# Patient Record
Sex: Female | Born: 1975 | Race: White | Hispanic: No | Marital: Married | Smoking: Never smoker
Health system: Southern US, Community
[De-identification: ages and names within clinical notes are randomized; demographics above are authoritative.]

## PROBLEM LIST (undated history)

## (undated) DIAGNOSIS — Z800 Family history of malignant neoplasm of digestive organs: Secondary | ICD-10-CM

## (undated) DIAGNOSIS — K219 Gastro-esophageal reflux disease without esophagitis: Secondary | ICD-10-CM

---

## 2006-04-21 ENCOUNTER — Ambulatory Visit: Payer: Self-pay | Admitting: Internal Medicine

## 2014-05-04 ENCOUNTER — Encounter: Payer: Self-pay | Admitting: Gastroenterology

## 2020-04-23 ENCOUNTER — Other Ambulatory Visit: Payer: Self-pay | Admitting: Neurosurgery

## 2020-04-23 ENCOUNTER — Emergency Department (HOSPITAL_COMMUNITY)
Admission: EM | Admit: 2020-04-23 | Discharge: 2020-04-23 | DRG: 552 | Disposition: A | Discharge status: Home / Self Care | Payer: BC Managed Care – PPO | Attending: Neurosurgery | Admitting: Neurosurgery

## 2020-04-23 ENCOUNTER — Emergency Department (HOSPITAL_COMMUNITY): Payer: BC Managed Care – PPO

## 2020-04-23 ENCOUNTER — Other Ambulatory Visit: Payer: Self-pay

## 2020-04-23 ENCOUNTER — Encounter (HOSPITAL_COMMUNITY): Payer: Self-pay

## 2020-04-23 DIAGNOSIS — M25512 Pain in left shoulder: Secondary | ICD-10-CM | POA: Diagnosis present

## 2020-04-23 DIAGNOSIS — Z7984 Long term (current) use of oral hypoglycemic drugs: Secondary | ICD-10-CM | POA: Diagnosis not present

## 2020-04-23 DIAGNOSIS — M5020 Other cervical disc displacement, unspecified cervical region: Secondary | ICD-10-CM | POA: Diagnosis not present

## 2020-04-23 DIAGNOSIS — M792 Neuralgia and neuritis, unspecified: Secondary | ICD-10-CM

## 2020-04-23 IMAGING — MR MR CERVICAL SPINE W/O CM
5 series · 48 of 48 positions shown · non-contrast
Comparison: None.

CLINICAL DATA: Left arm radiculopathy.  Left shoulder pain 3 weeks.

EXAM:
MRI CERVICAL SPINE WITHOUT CONTRAST
TECHNIQUE: Multiplanar, multisequence MR imaging of the cervical spine was
performed. No intravenous contrast was administered.

[Series 5: T2 · sagittal · 3.0mm · 0.69mm/px · 6 of 15 slices shown]
[im 1/15]
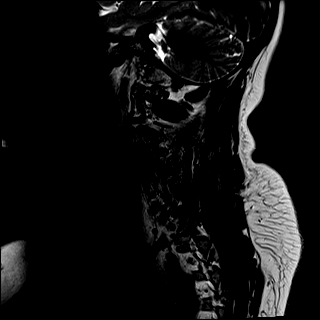
[im 3/15]
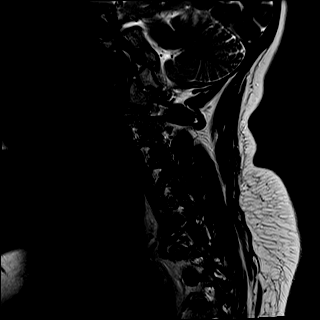
[im 6/15]
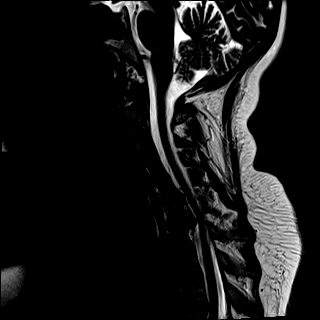
[im 9/15]
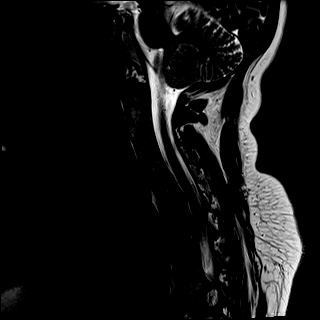
[im 12/15]
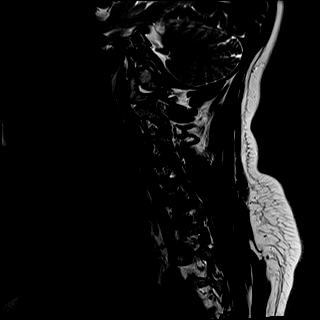
[im 15/15]
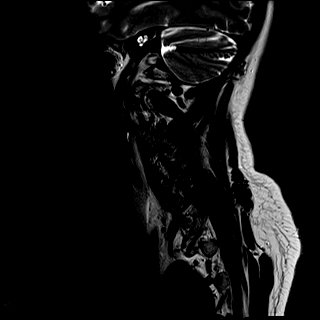

[Series 6: T1 · sagittal · 3.0mm · 0.69mm/px · 7 of 15 slices shown]
[im 1/15]
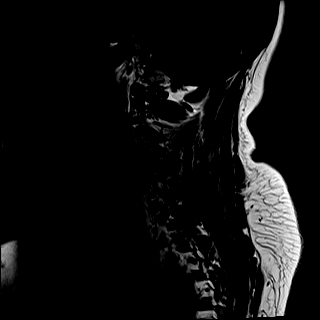
[im 3/15]
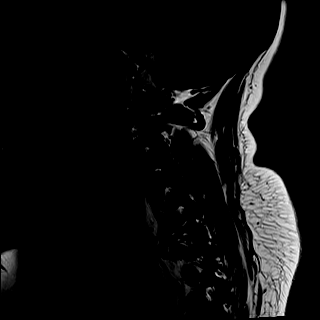
[im 5/15]
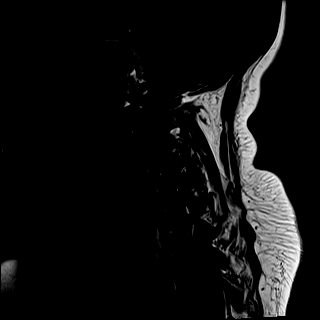
[im 8/15]
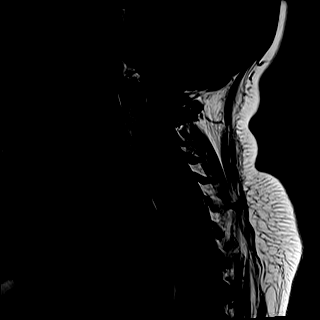
[im 10/15]
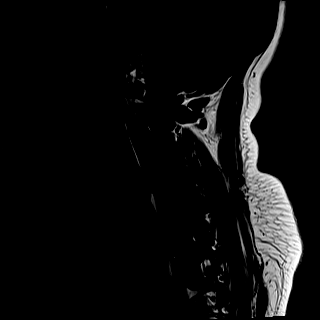
[im 12/15]
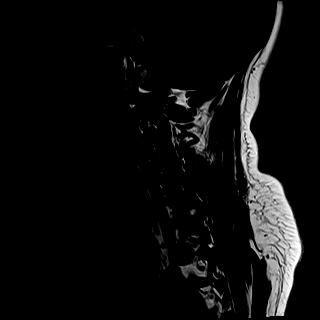
[im 15/15]
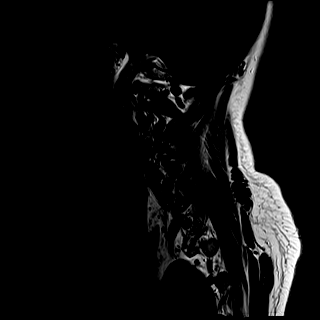

[Series 7: STIR · sagittal · 3.0mm · 0.86mm/px · 7 of 15 slices shown]
[im 1/15]
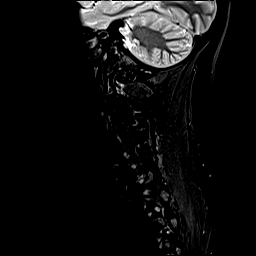
[im 3/15]
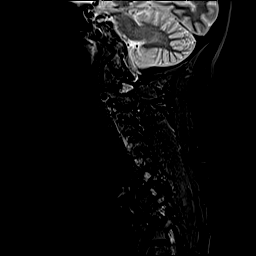
[im 5/15]
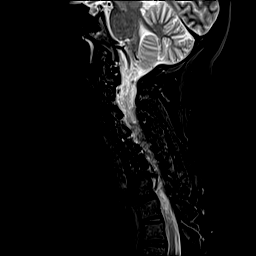
[im 8/15]
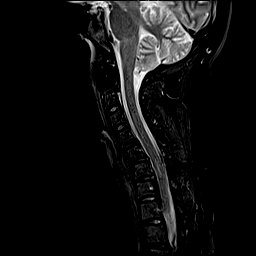
[im 10/15]
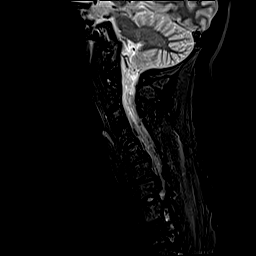
[im 12/15]
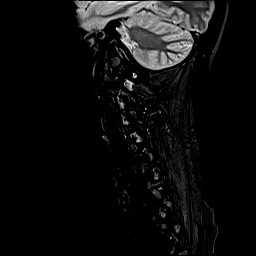
[im 15/15]
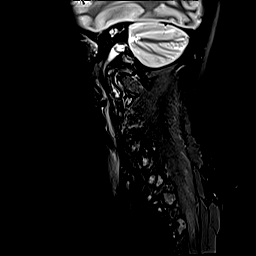

[Series 1018: csp ax t2_new · axial · 3.0mm · 0.66mm/px · z∈[-131,-43]mm · 14 of 32 slices shown]
[im 1/32]
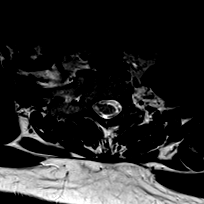
[im 3/32]
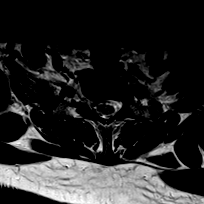
[im 5/32]
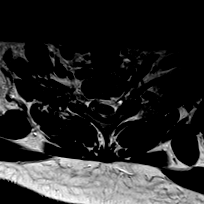
[im 8/32]
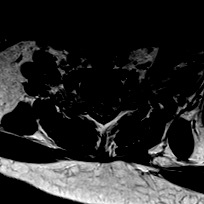
[im 10/32]
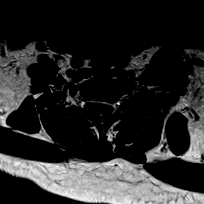
[im 12/32]
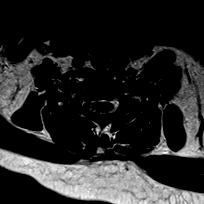
[im 15/32]
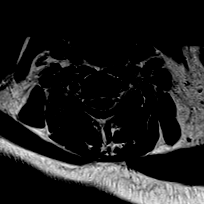
[im 17/32]
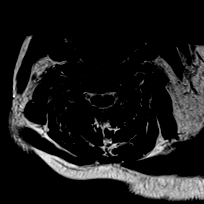
[im 20/32]
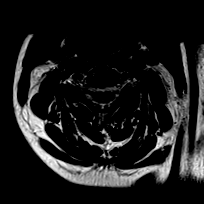
[im 22/32]
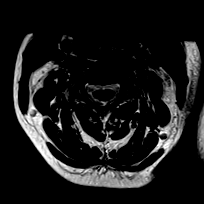
[im 24/32]
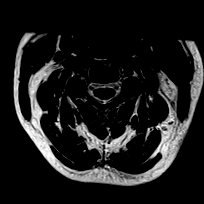
[im 27/32]
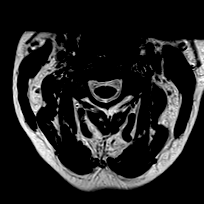
[im 29/32]
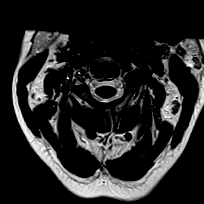
[im 32/32]
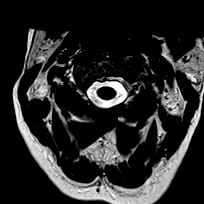

[Series 1026: csp ax gre_new · axial · 3.0mm · 0.39mm/px · z∈[-128,-40]mm · 14 of 32 slices shown]
[im 1/32]
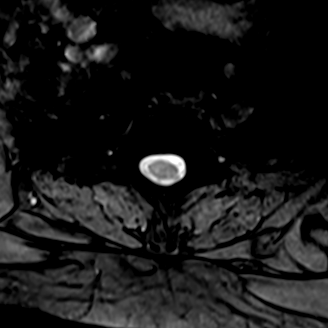
[im 3/32]
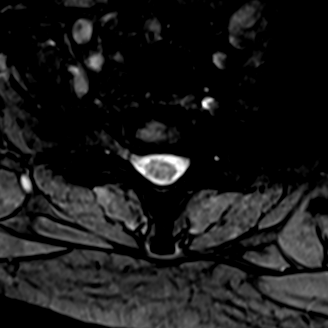
[im 5/32]
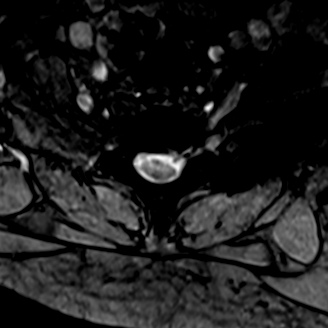
[im 8/32]
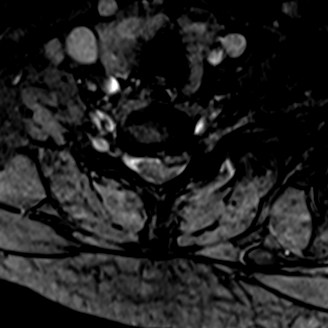
[im 10/32]
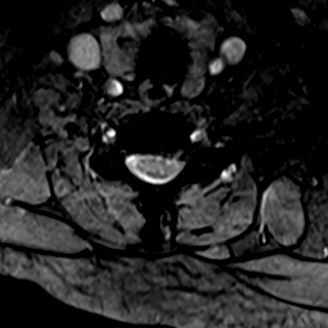
[im 12/32]
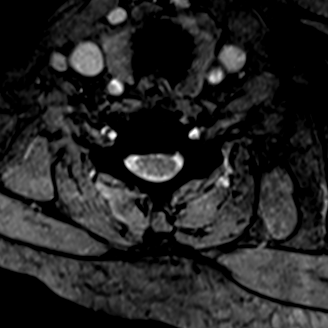
[im 15/32]
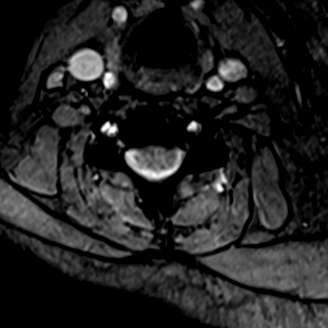
[im 17/32]
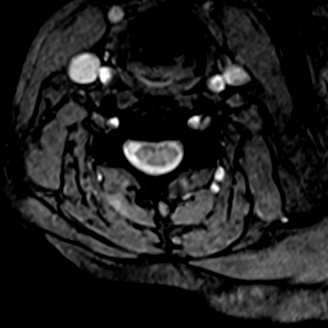
[im 20/32]
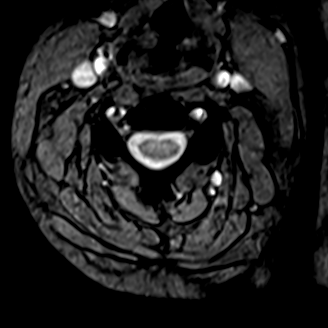
[im 22/32]
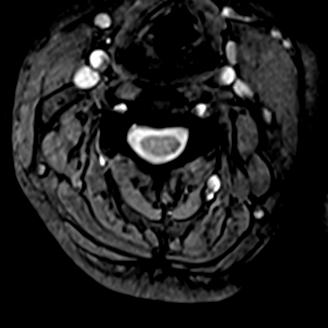
[im 24/32]
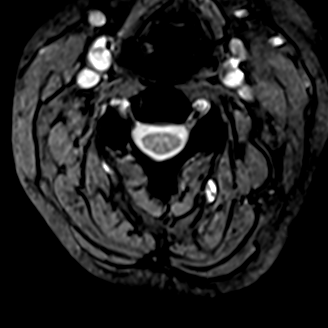
[im 27/32]
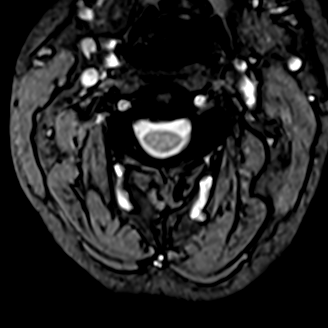
[im 29/32]
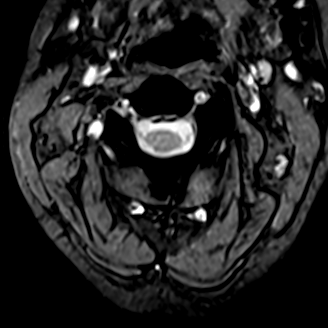
[im 32/32]
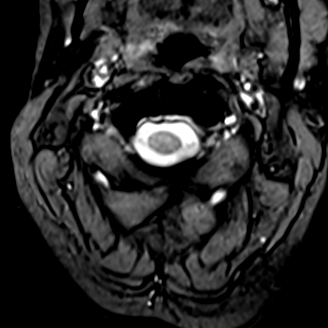

[48 of 48 positions shown; findings below may reference images not displayed]

FINDINGS: Alignment: Mild anterolisthesis C4-5 and C5-6. Cervical kyphosis at
C5.

Vertebrae: Negative for fracture or mass.  Normal bone marrow.

Cord: Normal spinal cord signal.  No cord lesion.

Posterior Fossa, vertebral arteries, paraspinal tissues: Negative

Disc levels:

C2-3: Negative

C3-4: Mild facet degeneration. Negative for disc protrusion or
stenosis

C4-5: Mild disc and mild facet degeneration. Negative for disc
protrusion or stenosis.

C5-6: Mild anterolisthesis. Disc degeneration with diffuse uncinate
spurring. Small left-sided disc protrusion. Mild foraminal narrowing
bilaterally. Mild spinal stenosis

C6-7: Large extruded disc fragment on the left with cord flattening
and severe left foraminal encroachment. Mild associated spurring.

C7-T1: Negative
IMPRESSION: Disc and facet degeneration at C5-6 with mild foraminal stenosis
bilaterally and mild spinal stenosis

Large extruded disc fragment on the left at C6-7. Severe left
foraminal encroachment and left cord flattening.

## 2020-04-23 MED ORDER — DEXAMETHASONE SODIUM PHOSPHATE 4 MG/ML IJ SOLN: 4.0000 mg | Freq: Four times a day (QID) | INTRAMUSCULAR | Status: DC

## 2020-04-23 MED ORDER — HYDROMORPHONE HCL 1 MG/ML IJ SOLN: 1.0000 mg | Freq: Once | INTRAMUSCULAR | Status: DC

## 2020-04-23 MED ORDER — HYDROMORPHONE HCL 1 MG/ML IJ SOLN: 0.5000 mg | INTRAMUSCULAR | Status: DC | PRN

## 2020-04-23 MED ORDER — DOCUSATE SODIUM 100 MG PO CAPS: 100.0000 mg | ORAL_CAPSULE | Freq: Two times a day (BID) | ORAL | Status: DC

## 2020-04-23 MED ORDER — SODIUM CHLORIDE 0.9% FLUSH: 3.0000 mL | INTRAVENOUS | Status: DC | PRN

## 2020-04-23 MED ORDER — DEXAMETHASONE SODIUM PHOSPHATE 10 MG/ML IJ SOLN
10.0000 mg | Freq: Once | INTRAMUSCULAR | Status: AC
  Administered 2020-04-23: 10 mg via INTRAMUSCULAR
  Filled 2020-04-23: qty 1

## 2020-04-23 MED ORDER — BISACODYL 10 MG RE SUPP: 10.0000 mg | Freq: Every day | RECTAL | Status: DC | PRN

## 2020-04-23 MED ORDER — OXYCODONE HCL 5 MG PO TABS: 5.0000 mg | ORAL_TABLET | ORAL | Status: DC | PRN

## 2020-04-23 MED ORDER — DIAZEPAM 5 MG PO TABS
10.0000 mg | ORAL_TABLET | Freq: Once | ORAL | Status: AC
  Administered 2020-04-23: 10 mg via ORAL
  Filled 2020-04-23: qty 2

## 2020-04-23 MED ORDER — SODIUM CHLORIDE 0.9 % IV SOLN: 250.0000 mL | INTRAVENOUS | Status: DC | PRN

## 2020-04-23 MED ORDER — ACETAMINOPHEN 650 MG RE SUPP: 650.0000 mg | Freq: Four times a day (QID) | RECTAL | Status: DC | PRN

## 2020-04-23 MED ORDER — METHOCARBAMOL 1000 MG/10ML IJ SOLN
500.0000 mg | Freq: Four times a day (QID) | INTRAVENOUS | Status: DC | PRN
  Filled 2020-04-23: qty 5

## 2020-04-23 MED ORDER — SODIUM CHLORIDE 0.9 % IV SOLN: INTRAVENOUS | Status: DC

## 2020-04-23 MED ORDER — POLYETHYLENE GLYCOL 3350 17 G PO PACK: 17.0000 g | PACK | Freq: Every day | ORAL | Status: DC | PRN

## 2020-04-23 MED ORDER — ONDANSETRON HCL 4 MG/2ML IJ SOLN: 4.0000 mg | Freq: Four times a day (QID) | INTRAMUSCULAR | Status: DC | PRN

## 2020-04-23 MED ORDER — ZOLPIDEM TARTRATE 5 MG PO TABS: 5.0000 mg | ORAL_TABLET | Freq: Every evening | ORAL | Status: DC | PRN

## 2020-04-23 MED ORDER — ACETAMINOPHEN 325 MG PO TABS: 650.0000 mg | ORAL_TABLET | Freq: Four times a day (QID) | ORAL | Status: DC | PRN

## 2020-04-23 MED ORDER — SODIUM CHLORIDE 0.9% FLUSH: 3.0000 mL | Freq: Two times a day (BID) | INTRAVENOUS | Status: DC

## 2020-04-23 MED ORDER — ONDANSETRON HCL 4 MG PO TABS: 4.0000 mg | ORAL_TABLET | Freq: Four times a day (QID) | ORAL | Status: DC | PRN

## 2020-04-23 NOTE — ED Provider Notes (Signed)
Signout from Dr. Jodi Mourning.  44 year old female with left shoulder pain and left radicular symptoms.  MRI showing disc herniation.  Plan is to admit to [facility] for neurosurgical procedure. Physical Exam  BP (!) 121/62 (BP Location: Right Arm)   Pulse 83   Temp 98.2 F (36.8 C) (Oral)   Resp 14   Ht 5\' 7"  (1.702 m)   Wt (!) 93.9 kg   LMP 04/16/2020   SpO2 100%   BMI 32.42 kg/m   Physical Exam  ED Course/Procedures     Procedures  MDM  I was informed that the patient wanted to talk to neurosurgery regarding the procedure.  I conferenced her with 04/18/2020 PA and they discussed possible options.  Ultimately she decided to be discharged here and go to the clinic tomorrow where she can be evaluated.  Return instructions discussed.       Cassell Smiles, MD 04/24/20 [phone]

## 2020-04-23 NOTE — ED Notes (Signed)
States she prefers to go home and be seen in the office tomorrow.

## 2020-04-23 NOTE — ED Provider Notes (Addendum)
[facility] EMERGENCY DEPARTMENT Provider Note   CSN: [id number] Arrival date & time: 04/23/20  1150     History Chief Complaint  Patient presents with  . Shoulder Pain    Jocelyn Crosby is a 44 y.o. female.  Patient presents worsening left shoulder pain radiating down left arm with numbness in pointer and middle fingers for the past 16 days.  Pain intermittent but gradually worsening.  Pain makes it difficulty to sleep and she has to be in specific positions.  No history of back or neck surgeries or issues.  No injuries.  Patient has tried multiple different medications over-the-counter and prescribed with no significant improvement including steroids, narcotics, Tylenol and chiropractics.        History reviewed. No pertinent past medical history.  Patient Active Problem List   Diagnosis Date Noted  . 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 Cervical herniated disc 04/23/2020    History reviewed. No pertinent surgical history.   OB History   No obstetric history on file.     No family history on file.  Social History   Tobacco Use  . Smoking status: Not on file  Substance Use Topics  . Alcohol use: Not on file  . Drug use: Not on file    Home Medications Prior to Admission medications   Medication Sig Start Date End Date Taking? Authorizing Provider  gabapentin (NEURONTIN) 300 MG capsule Take 300 mg by mouth in the morning and at bedtime. 04/21/20  Yes [provider]  HYDROcodone-acetaminophen (NORCO/VICODIN) 5-325 MG tablet Take 1 tablet by mouth every 6 (six) hours as needed for moderate pain.  04/22/20  Yes [provider]  metFORMIN (GLUCOPHAGE) 500 MG tablet Take 500 mg by mouth 2 (two) times daily. 04/13/20  Yes [provider]  omeprazole (PRILOSEC) 40 MG capsule Take 40 mg by mouth every evening.   Yes [provider]    Allergies    Patient has no known allergies.  Review of Systems   Review of Systems    Constitutional: Negative for chills and fever.  HENT: Negative for congestion.   Eyes: Negative for visual disturbance.  Respiratory: Negative for shortness of breath.   Cardiovascular: Negative for chest pain.  Gastrointestinal: Negative for abdominal pain and vomiting.  Genitourinary: Negative for dysuria and flank pain.  Musculoskeletal: Positive for neck pain. Negative for back pain and neck stiffness.  Skin: Negative for rash.  Neurological: Positive for numbness. Negative for weakness, light-headedness and headaches.    Physical Exam Updated Vital Signs BP (!) 121/62 (BP Location: Right Arm)   Pulse 83   Temp 98.2 F (36.8 C) (Oral)   Resp 14   Ht 5\' 7"  (1.702 m)   Wt (!) 93.9 kg   LMP 04/16/2020   SpO2 100%   BMI 32.42 kg/m   Physical Exam Vitals and nursing note reviewed.  Constitutional:      Appearance: She is well-developed.  HENT:     Head: Normocephalic and atraumatic.  Eyes:     General:        Right eye: No discharge.        Left eye: No discharge.     Conjunctiva/sclera: Conjunctivae normal.  Neck:     Trachea: No tracheal deviation.  Cardiovascular:     Rate and Rhythm: Normal rate.  Pulmonary:     Effort: Pulmonary effort is normal.  Abdominal:     General: There is no distension.     Palpations: Abdomen  is soft.     Tenderness: There is no abdominal tenderness. There is no guarding.  Musculoskeletal:        General: Tenderness present.     Cervical back: Normal range of motion and neck supple.  Skin:    General: Skin is warm.     Findings: No rash.  Neurological:     Mental Status: She is alert and oriented to person, place, and time.     Comments: Patient's position of comfort holding left arm and forearm at her side.  Pain with abduction.  Patient has sensation to palpation intact however decreased sensation index and middle finger on the left.  Patient has normal 5+ strength with discomfort shoulder abduction, arm flexion, wrist extension  and flexion and finger abduction and adduction.     ED Results / Procedures / Treatments   Labs (all labs ordered are listed, but only abnormal results are displayed) Labs Reviewed  SARS CORONAVIRUS 2 BY RT PCR (HOSPITAL ORDER, PERFORMED IN [facility])  CBC WITH DIFFERENTIAL/PLATELET  BASIC METABOLIC PANEL  HIV ANTIBODY (ROUTINE TESTING W REFLEX)  POC URINE PREG, ED    EKG None  Radiology MR Cervical Spine Wo Contrast  Result Date: 04/23/2020 CLINICAL DATA:  Left arm radiculopathy.  Left shoulder pain 3 weeks. EXAM: MRI CERVICAL SPINE WITHOUT CONTRAST TECHNIQUE: Multiplanar, multisequence MR imaging of the cervical spine was performed. No intravenous contrast was administered. COMPARISON:  None. FINDINGS: Alignment: Mild anterolisthesis C4-5 and C5-6. Cervical kyphosis at C5. Vertebrae: Negative for fracture or mass.  Normal bone marrow. Cord: Normal spinal cord signal.  No cord lesion. Posterior Fossa, vertebral arteries, paraspinal tissues: Negative Disc levels: C2-3: Negative C3-4: Mild facet degeneration. Negative for disc protrusion or stenosis C4-5: Mild disc and mild facet degeneration. Negative for disc protrusion or stenosis. C5-6: Mild anterolisthesis. Disc degeneration with diffuse uncinate spurring. Small left-sided disc protrusion. Mild foraminal narrowing bilaterally. Mild spinal stenosis C6-7: Large extruded disc fragment on the left with cord flattening and severe left foraminal encroachment. Mild associated spurring. C7-T1: Negative IMPRESSION: Disc and facet degeneration at C5-6 with mild foraminal stenosis bilaterally and mild spinal stenosis Large extruded disc fragment on the left at C6-7. Severe left foraminal encroachment and left cord flattening. Electronically Signed   By: Marlan Palau M.D.   On: 04/23/2020 14:51    Procedures Procedures (including critical care time)  Medications Ordered in ED Medications  HYDROmorphone (DILAUDID) injection 1 mg  (has no administration in time range)  0.9 %  sodium chloride infusion (has no administration in time range)  zolpidem (AMBIEN) tablet 5 mg (has no administration in time range)  docusate sodium (COLACE) capsule 100 mg (has no administration in time range)  polyethylene glycol (MIRALAX / GLYCOLAX) packet 17 g (has no administration in time range)  bisacodyl (DULCOLAX) suppository 10 mg (has no administration in time range)  ondansetron (ZOFRAN) tablet 4 mg (has no administration in time range)    Or  ondansetron (ZOFRAN) injection 4 mg (has no administration in time range)  methocarbamol (ROBAXIN) 500 mg in dextrose 5 % 50 mL IVPB (has no administration in time range)  HYDROmorphone (DILAUDID) injection 0.5-1 mg (has no administration in time range)  oxyCODONE (Oxy IR/ROXICODONE) immediate release tablet 5 mg (has no administration in time range)  acetaminophen (TYLENOL) tablet 650 mg (has no administration in time range)    Or  acetaminophen (TYLENOL) suppository 650 mg (has no administration in time range)  sodium chloride flush (NS)  0.9 % injection 3 mL (has no administration in time range)  sodium chloride flush (NS) 0.9 % injection 3 mL (has no administration in time range)  0.9 %  sodium chloride infusion (has no administration in time range)  dexamethasone (DECADRON) injection 4 mg (has no administration in time range)  diazepam (VALIUM) tablet 10 mg (10 mg Oral Given 04/23/20 1337)  dexamethasone (DECADRON) injection 10 mg (10 mg Intramuscular Given 04/23/20 1337)    ED Course  I have reviewed the triage vital signs and the nursing notes.  Pertinent labs & imaging results that were available during my care of the patient were reviewed by me and considered in my medical decision making (see chart for details).    MDM Rules/Calculators/A&P                          MRI results reviewed showing large extruded disc fragment at C6-7 with encroachment and cord flattening. With  significant pain, worsening signs and symptoms discussed with neurosurgery who agrees with transfer to [facility] to plan for surgery hopefully in the next few days. Basic blood work and Covid test added.  Discussed with patient transfer to [facility].  Patient would like to speak with neurosurgery to gauge further details on how many days in the hospital and more details on surgery before transfer.  Repaged neurosurgery.    Final Clinical Impression(s) / ED Diagnoses Final diagnoses:  Cervical disc herniation  Radicular pain in left arm    Rx / DC Orders ED Discharge Orders    None       Blane Ohara, MD 04/23/20 1558    Blane Ohara, MD 04/23/20 [phone]

## 2020-04-23 NOTE — ED Triage Notes (Signed)
Pt is having left shoulder pain that started 3 weeks ago. 2 weeks ago she began having numbness  And pain that radiated down her arm. Seen PCP and needs an MRI. Her insurance was denied for MRI, so she is here today.

## 2020-04-23 NOTE — Discharge Instructions (Addendum)
Follow-up with neurosurgery.  Take your medications as previously prescribed.

## 2020-04-23 NOTE — ED Notes (Signed)
Discharged during downtime , to followup with neurology tomorrow.

## 2020-04-25 ENCOUNTER — Inpatient Hospital Stay: Admit: 2020-04-25 | Payer: BC Managed Care – PPO | Admitting: Neurosurgery

## 2020-04-25 SURGERY — ANTERIOR CERVICAL DECOMPRESSION/DISCECTOMY FUSION 2 LEVELS
Anesthesia: General

## 2020-08-09 ENCOUNTER — Other Ambulatory Visit: Payer: Self-pay | Admitting: Physician Assistant

## 2020-08-09 ENCOUNTER — Other Ambulatory Visit (HOSPITAL_COMMUNITY): Payer: Self-pay | Admitting: Physician Assistant

## 2020-08-09 DIAGNOSIS — M542 Cervicalgia: Secondary | ICD-10-CM

## 2020-08-09 DIAGNOSIS — M5441 Lumbago with sciatica, right side: Secondary | ICD-10-CM

## 2020-08-21 ENCOUNTER — Ambulatory Visit (HOSPITAL_COMMUNITY)
Admission: RE | Admit: 2020-08-21 | Discharge: 2020-08-21 | Disposition: A | Discharge status: Home / Self Care | Payer: BC Managed Care – PPO | Source: Ambulatory Visit | Attending: Physician Assistant | Admitting: Physician Assistant

## 2020-08-21 ENCOUNTER — Other Ambulatory Visit: Payer: Self-pay

## 2020-08-21 DIAGNOSIS — M542 Cervicalgia: Secondary | ICD-10-CM | POA: Diagnosis present

## 2020-08-21 DIAGNOSIS — M5441 Lumbago with sciatica, right side: Secondary | ICD-10-CM | POA: Insufficient documentation

## 2020-08-21 IMAGING — MR MR CERVICAL SPINE W/O CM
5 series · 36 of 48 positions shown · non-contrast
Comparison: [DATE]

CLINICAL DATA: Low back pain radiating down both legs. Neck pain
with bilateral finger numbness. History of cervical spine surgery

EXAM:
MRI CERVICAL SPINE WITHOUT CONTRAST
TECHNIQUE: Multiplanar, multisequence MR imaging of the cervical spine was
performed. No intravenous contrast was administered.

[Series 5: t2_tse_sag_fast · sagittal · 3.0mm · 0.43mm/px · 6 of 15 slices shown]
[im 1/15]
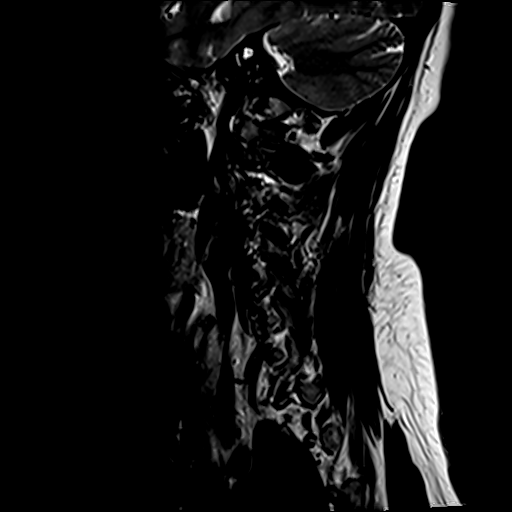
[im 3/15]
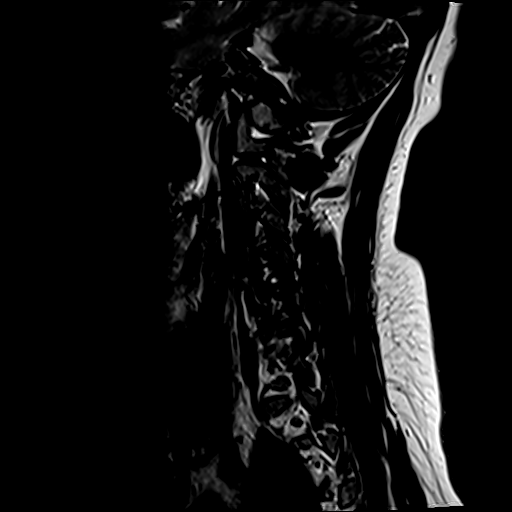
[im 6/15]
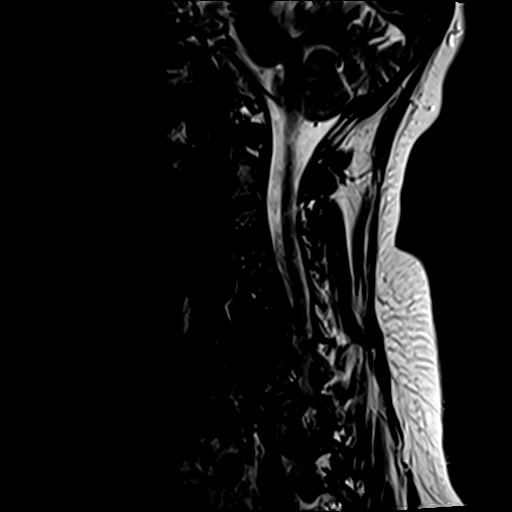
[im 9/15]
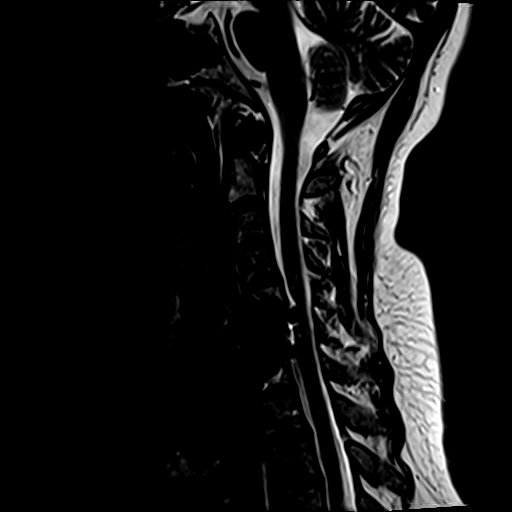
[im 12/15]
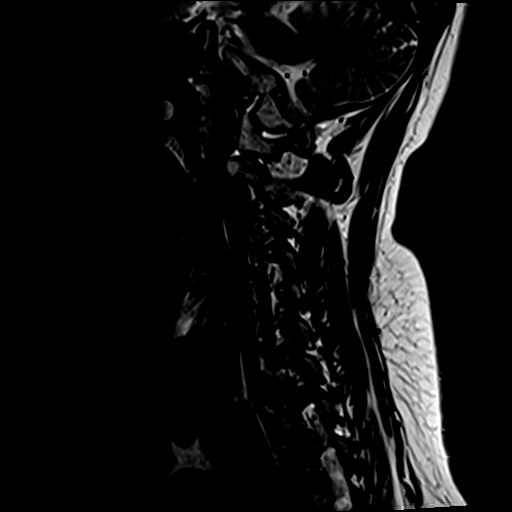
[im 15/15]
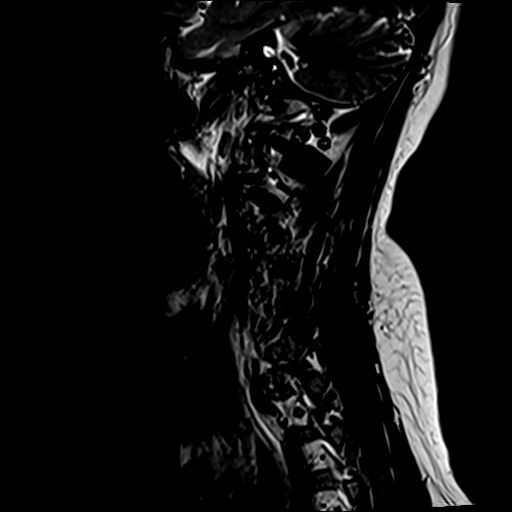

[Series 6: t1_tse_sag_fast · sagittal · 3.0mm · 0.43mm/px · 6 of 15 slices shown]
[im 1/15]
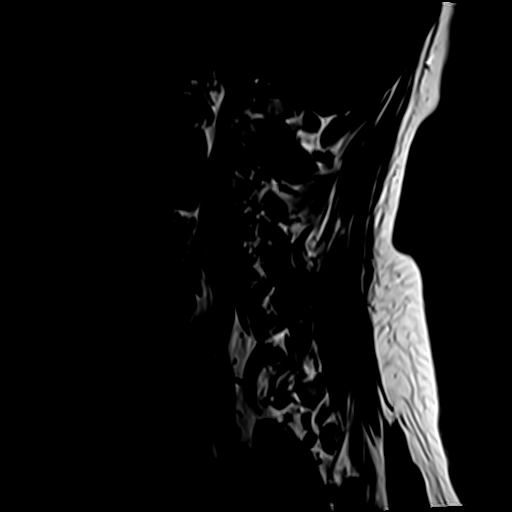
[im 3/15]
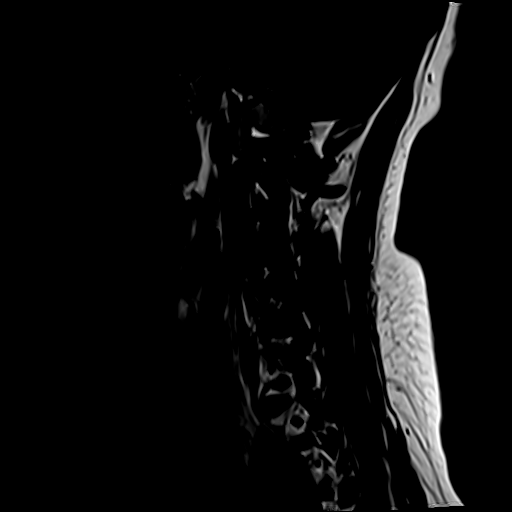
[im 6/15]
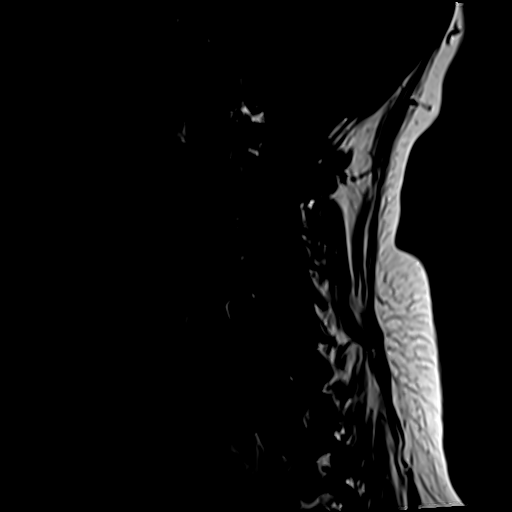
[im 9/15]
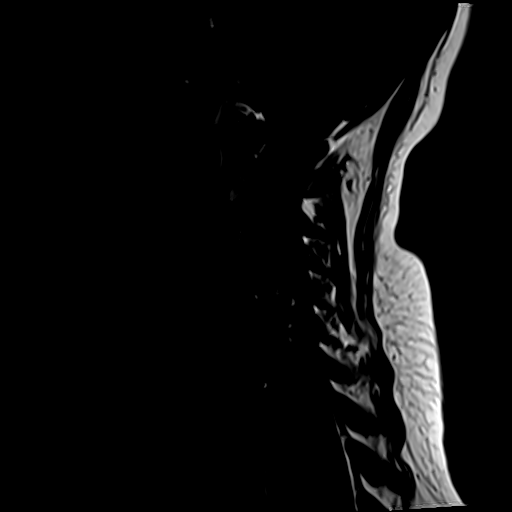
[im 12/15]
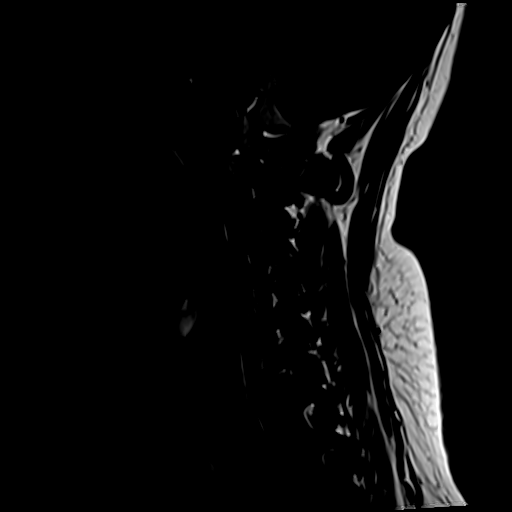
[im 15/15]
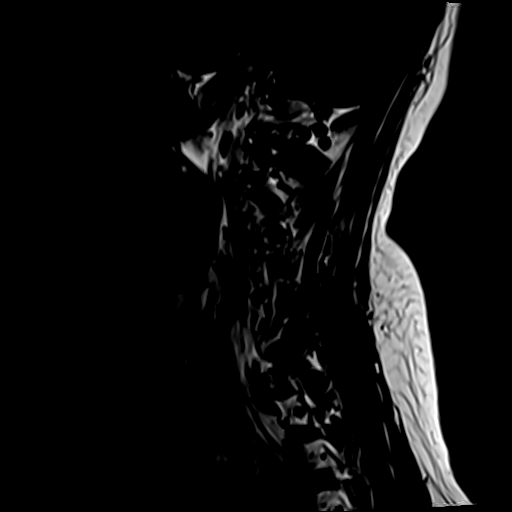

[Series 7: STIR · sagittal · 3.0mm · 0.86mm/px · 6 of 15 slices shown]
[im 1/15]
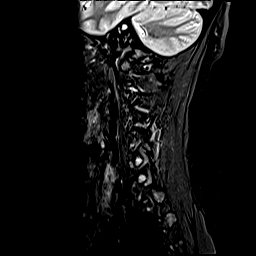
[im 3/15]
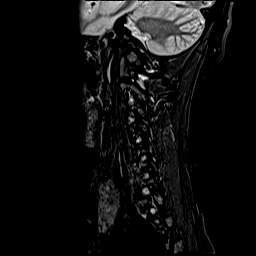
[im 6/15]
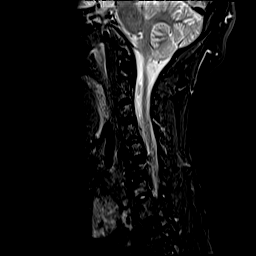
[im 9/15]
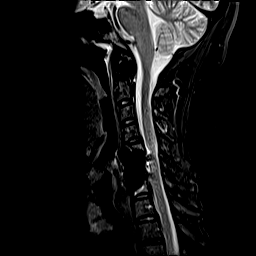
[im 12/15]
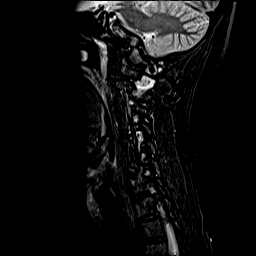
[im 15/15]
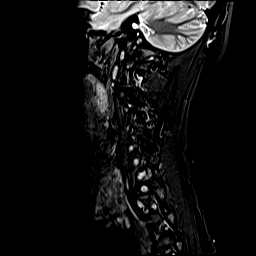

[Series 8: t2_tse_tra_fast · axial · 3.0mm · 0.78mm/px · z∈[-129,-10]mm · 10 of 38 slices shown]
[im 1/38]
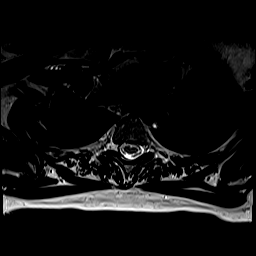
[im 3/38]
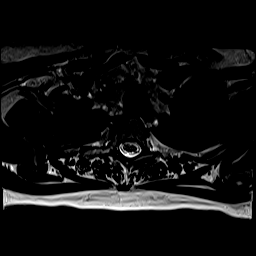
[im 6/38]
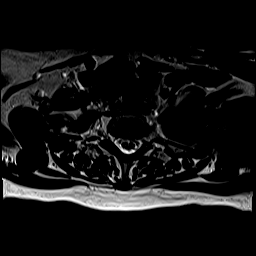
[im 11/38]
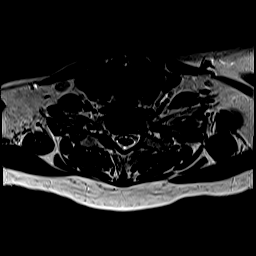
[im 16/38]
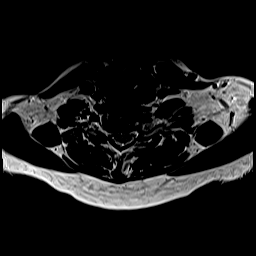
[im 19/38]
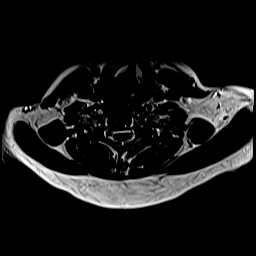
[im 22/38]
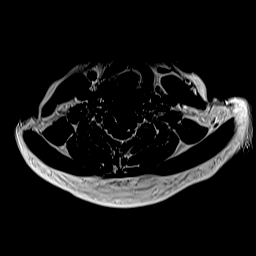
[im 27/38]
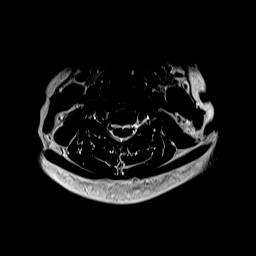
[im 32/38]
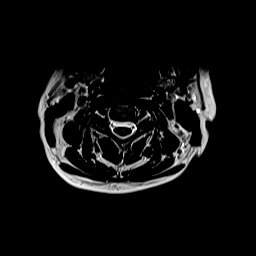
[im 38/38]
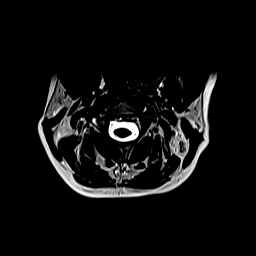

[Series 9: GRE · axial · 3.0mm · 0.78mm/px · z∈[-129,-10]mm · 8 of 38 slices shown]
[im 1/38]
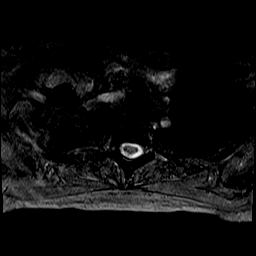
[im 6/38]
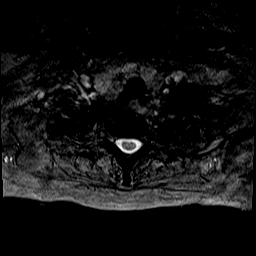
[im 11/38]
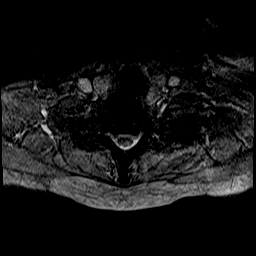
[im 16/38]
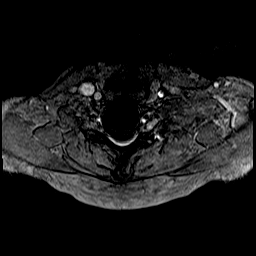
[im 22/38]
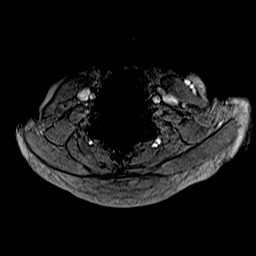
[im 27/38]
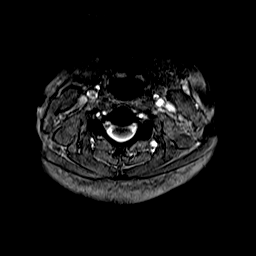
[im 32/38]
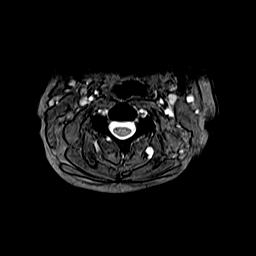
[im 38/38]
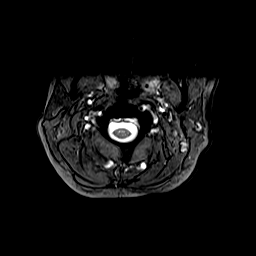

[36 of 48 positions shown; findings below may reference images not displayed]

FINDINGS: Alignment: Improved spinal alignment, now with generalized
straightening rather than kyphosis.

Vertebrae: Interval C5-6 and C6-7 ACDF. Artifact limits
visualization of the postoperative vertebral bodies. No evidence of
fracture, discitis, or aggressive bone lesion.

Cord: Normal signal and morphology.

Posterior Fossa, vertebral arteries, paraspinal tissues: No visible
mass or inflammation. ~2 cm right thyroid nodule, obscured on
prior.

Disc levels:

C2-3: Unremarkable.

C3-4: Unremarkable.

C4-5: Unremarkable.

C5-6: ACDF.  The canal and foramina appear patent on axial slices

C6-7: ACDF.  The canal and foramina appear patent

C7-T1:Unremarkable.
IMPRESSION: 1. C5-6 and C6-7 ACDF without complicating features.
2. No interval adjacent segment degeneration. No neural impingement.
3. ~2 cm right thyroid nodule. Recommend thyroid US.(ref: [HOSPITAL]. [DATE]): 143-50).

## 2020-08-21 IMAGING — MR MR LUMBAR SPINE W/O CM
5 series · 33 of 48 positions shown · non-contrast
Comparison: None.

CLINICAL DATA: Low back pain radiating down both legs for 2 weeks.

EXAM:
MRI LUMBAR SPINE WITHOUT CONTRAST
TECHNIQUE: Multiplanar, multisequence MR imaging of the lumbar spine was
performed. No intravenous contrast was administered.

[Series 26: T2 · sagittal · 4.0mm · 0.81mm/px · 6 of 16 slices shown (1 of 2)]
[im 1/16]
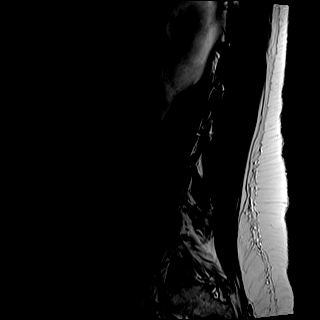
[im 4/16]
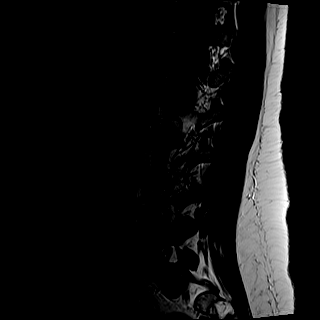
[im 7/16]
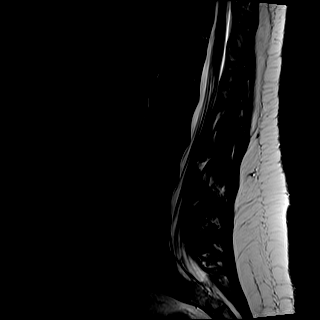
[im 10/16]
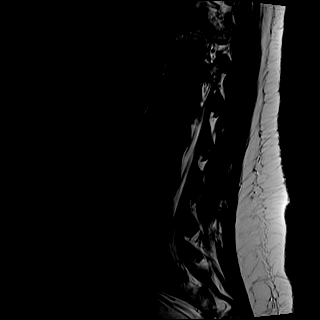
[im 13/16]
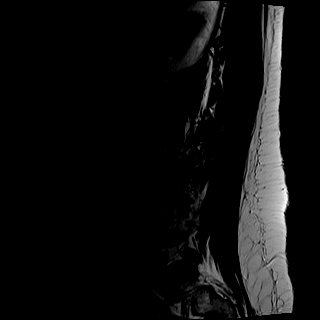
[im 16/16]
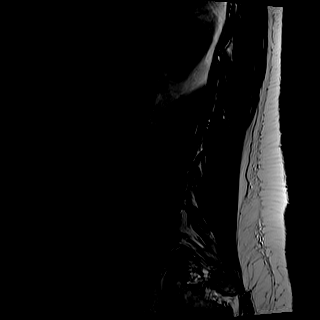

[Series 27: STIR · sagittal · 4.0mm · 0.51mm/px · 4 of 16 slices shown]
[im 1/16]
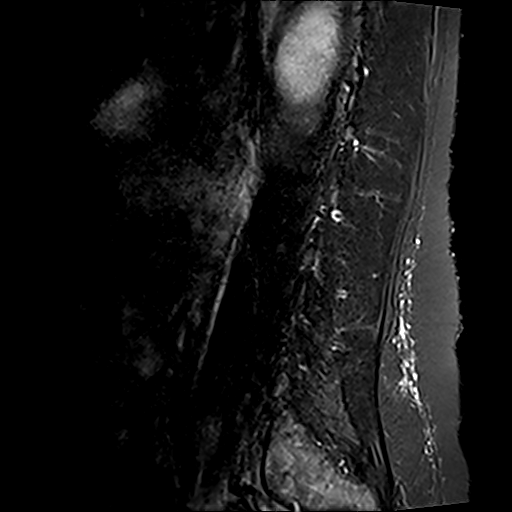
[im 3/16]
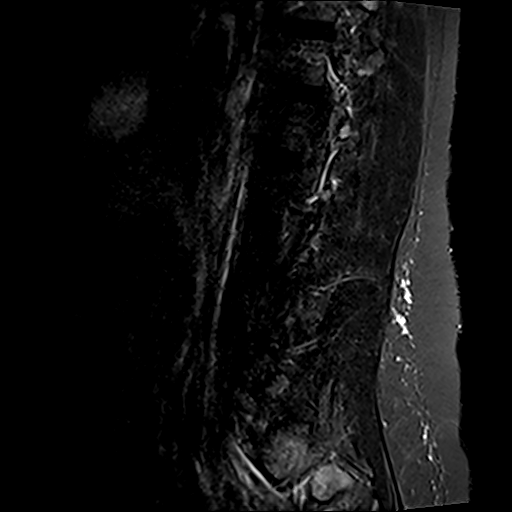
[im 6/16]
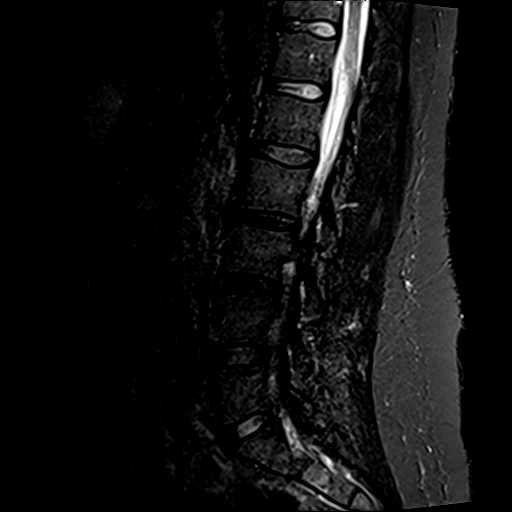
[im 8/16]
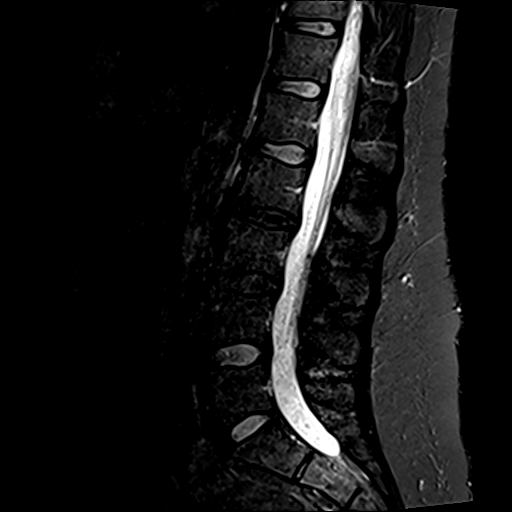

[Series 28: T1 · sagittal · 4.0mm · 1.02mm/px · 7 of 16 slices shown (1 of 2)]
[im 1/16]
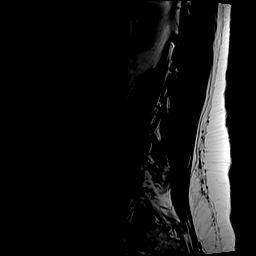
[im 3/16]
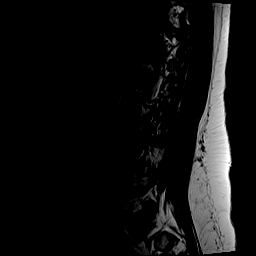
[im 6/16]
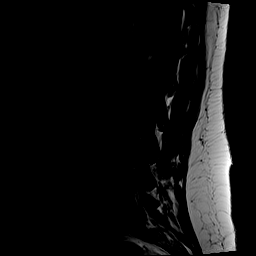
[im 8/16]
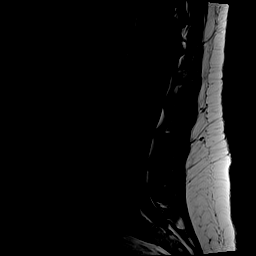
[im 11/16]
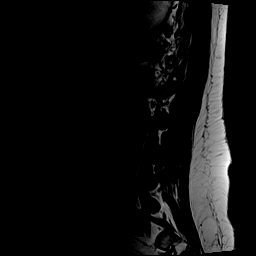
[im 13/16]
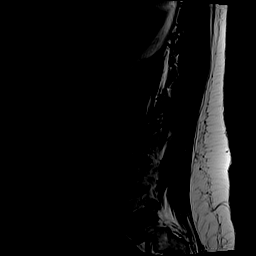
[im 16/16]
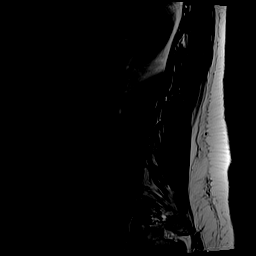

[Series 29: T2 · axial · 4.0mm · 0.70mm/px · z∈[+380,+561]mm · 8 of 31 slices shown (2 of 2)]
[im 1/31]
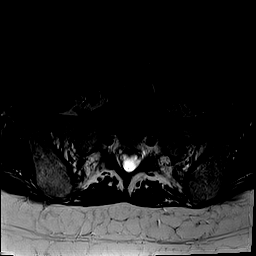
[im 5/31]
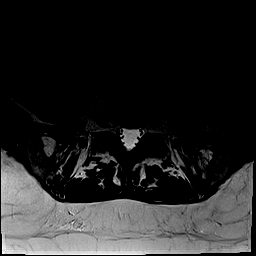
[im 10/31]
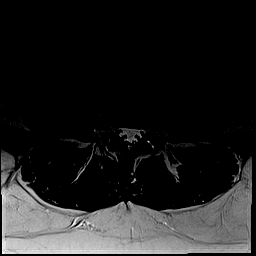
[im 14/31]
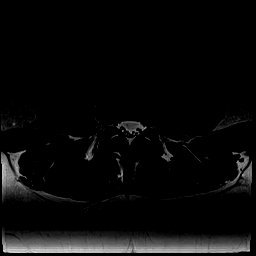
[im 17/31]
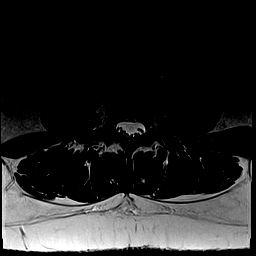
[im 21/31]
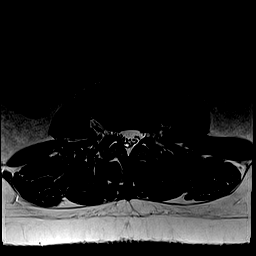
[im 26/31]
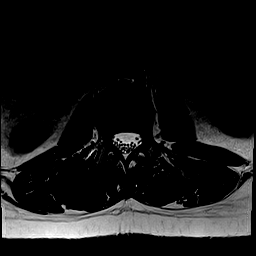
[im 31/31]
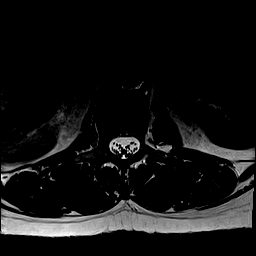

[Series 30: T1 · axial · 4.0mm · 0.35mm/px · z∈[+380,+561]mm · 8 of 31 slices shown (2 of 2)]
[im 1/31]
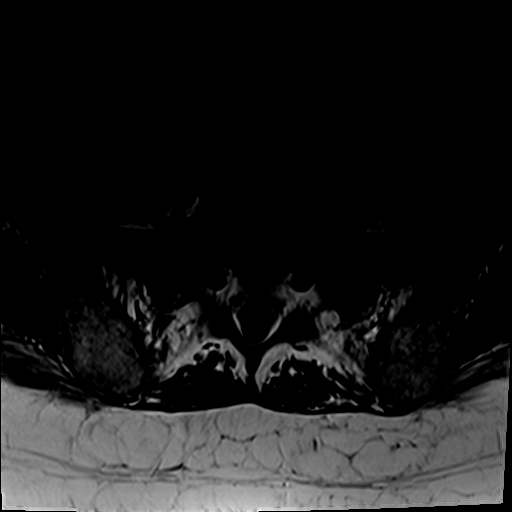
[im 5/31]
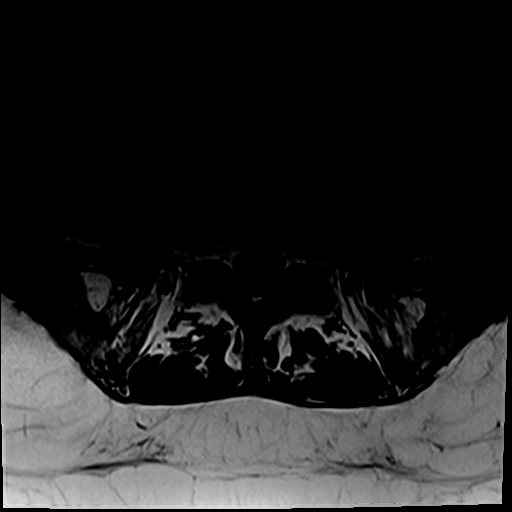
[im 10/31]
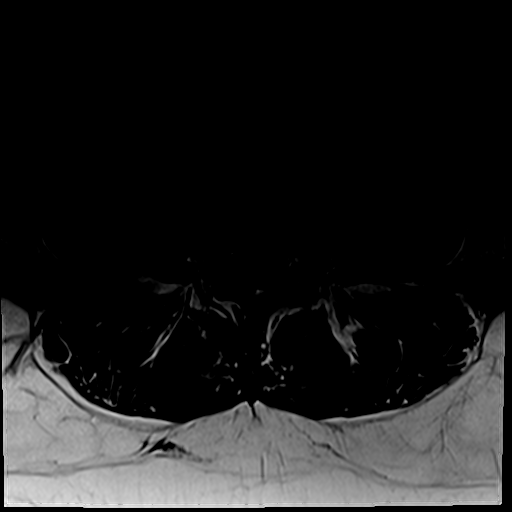
[im 14/31]
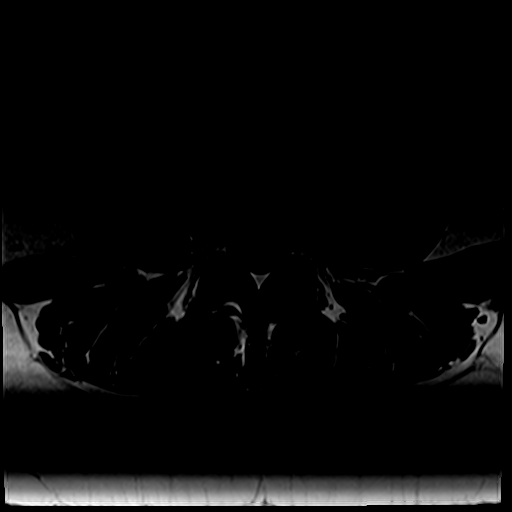
[im 17/31]
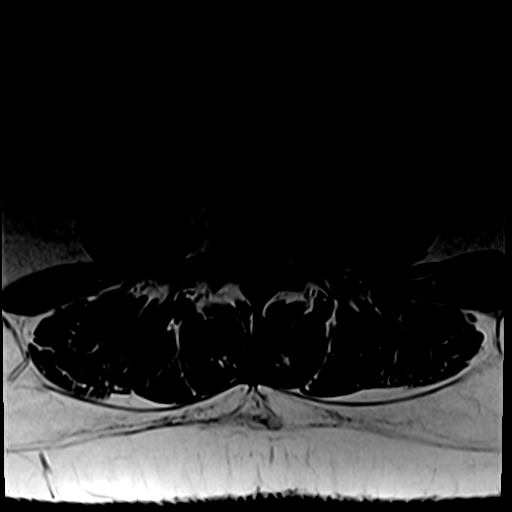
[im 21/31]
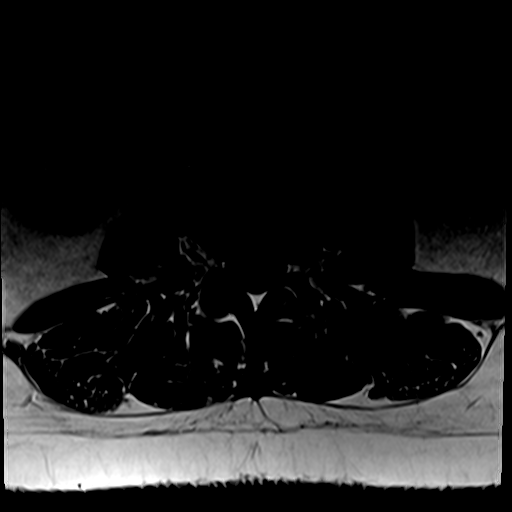
[im 26/31]
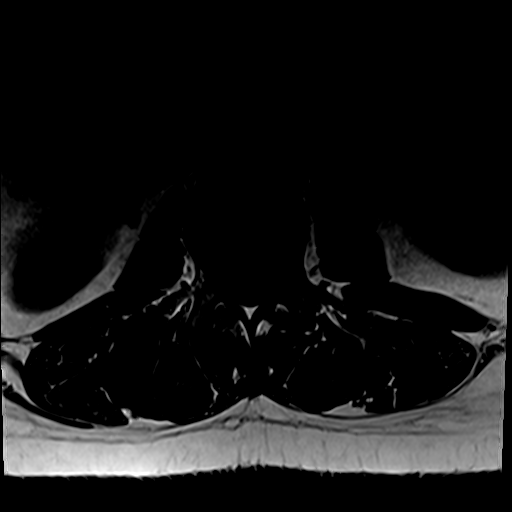
[im 31/31]
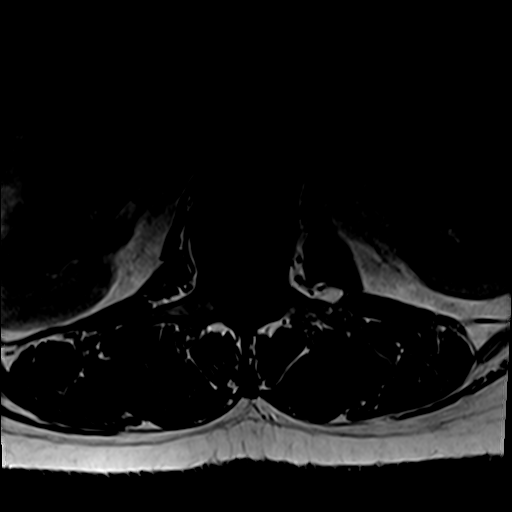

[33 of 48 positions shown; findings below may reference images not displayed]

FINDINGS: Segmentation: Standard lumbar numbering based on the available
coverage

Alignment:  Physiologic.

Vertebrae:  No fracture, evidence of discitis, or bone lesion.

Conus medullaris and cauda equina: Conus extends to the L1 level.
Conus and cauda equina appear normal.

Paraspinal and other soft tissues: No perispinal mass or
inflammation seen.

Disc levels:

L2-3 mild disc desiccation and narrowing with bulge. No neural
compression.

L3-4 mild disc desiccation and slight narrowing with disc bulging
and small left inferior foraminal protrusion. No neural compression.

Negative facets.
IMPRESSION: Mild disc degeneration at L2-3 and L3-4. No neural compression or
visible inflammation.

## 2024-03-20 ENCOUNTER — Other Ambulatory Visit (HOSPITAL_COMMUNITY): Payer: Self-pay | Admitting: Physician Assistant

## 2024-03-20 DIAGNOSIS — Z1231 Encounter for screening mammogram for malignant neoplasm of breast: Secondary | ICD-10-CM

## 2024-03-22 ENCOUNTER — Encounter (INDEPENDENT_AMBULATORY_CARE_PROVIDER_SITE_OTHER): Payer: Self-pay | Admitting: *Deleted

## 2024-07-12 ENCOUNTER — Telehealth (INDEPENDENT_AMBULATORY_CARE_PROVIDER_SITE_OTHER): Payer: Self-pay

## 2024-07-12 DIAGNOSIS — Z1211 Encounter for screening for malignant neoplasm of colon: Secondary | ICD-10-CM

## 2024-07-12 MED ORDER — PEG 3350-KCL-NA BICARB-NACL 420 G PO SOLR
4000.0000 mL | Freq: Once | ORAL | 0 refills | Status: AC
Start: 2024-07-12 — End: 2024-07-12

## 2024-07-12 NOTE — Telephone Encounter (Signed)
 Who is your primary care physician: Dr. Blondie at [facility]  Reasons for the colonoscopy: screening  Have you had a colonoscopy before?  no  Do you have family history of colon cancer? Yes, father  Previous colonoscopy with polyps removed? no  Do you have a history colorectal cancer?   no  Are you diabetic? If yes, Type 1 or Type 2?    no  Do you have a prosthetic or mechanical heart valve? no  Do you have a pacemaker/defibrillator?   no  Have you had endocarditis/atrial fibrillation? no  Have you had joint replacement within the last 12 months?  no  Do you tend to be constipated or have to use laxatives? no  Do you have any history of drugs or alchohol?  no  Do you use supplemental oxygen?  no  Have you had a stroke or heart attack within the last 6 months? no  Do you take weight loss medication?  no  For female patients: have you had a hysterectomy?  no                                     are you post menopausal?       no                                            do you still have your menstrual cycle? yes      Do you take any blood-thinning medications such as: (aspirin, warfarin, Plavix, Aggrenox)  no  If yes we need the name, milligram, dosage and who is prescribing doctor  Current Outpatient Medications on File Prior to Visit  Medication Sig Dispense Refill   cetirizine (ZYRTEC) 10 MG tablet Take 10 mg by mouth daily. As needed     omeprazole (PRILOSEC) 40 MG capsule Take 40 mg by mouth every evening.     No current facility-administered medications on file prior to visit.    No Known Allergies   Pharmacy: [facility]  Primary Insurance Name: [employer]

## 2024-07-12 NOTE — Telephone Encounter (Signed)
 Any room Thanks

## 2024-07-12 NOTE — Telephone Encounter (Signed)
 Spoke with patient, scheduled colonoscopy for 08/15/2024 at 1:30. Rx sent to pharmacy, instructions sent by mail. UPT orders in.

## 2024-07-12 NOTE — Addendum Note (Signed)
 Addended by: DALLIE LIONEL RAMAN on: 07/12/2024 03:48 PM   Modules accepted: Orders

## 2024-07-14 NOTE — Telephone Encounter (Signed)
 ATC patient, no answer. LVM with pre-op details.

## 2024-07-17 ENCOUNTER — Encounter (INDEPENDENT_AMBULATORY_CARE_PROVIDER_SITE_OTHER): Payer: Self-pay | Admitting: *Deleted

## 2024-07-17 NOTE — Telephone Encounter (Signed)
 Questionnaire from recall, no referral needed

## 2024-08-11 ENCOUNTER — Encounter (HOSPITAL_COMMUNITY)
Admission: RE | Admit: 2024-08-11 | Discharge: 2024-08-11 | Disposition: A | Discharge status: Home / Self Care | Source: Ambulatory Visit | Attending: Gastroenterology | Admitting: Gastroenterology

## 2024-08-11 ENCOUNTER — Other Ambulatory Visit (HOSPITAL_COMMUNITY)
Admission: RE | Admit: 2024-08-11 | Discharge: 2024-08-11 | Disposition: A | Discharge status: Home / Self Care | Source: Ambulatory Visit | Attending: Gastroenterology | Admitting: Gastroenterology

## 2024-08-11 DIAGNOSIS — Z01812 Encounter for preprocedural laboratory examination: Secondary | ICD-10-CM | POA: Diagnosis present

## 2024-08-11 HISTORY — DX: Family history of malignant neoplasm of digestive organs: Z80.0

## 2024-08-11 HISTORY — DX: Gastro-esophageal reflux disease without esophagitis: K21.9

## 2024-08-11 LAB — PREGNANCY, URINE: Preg Test, Ur: NEGATIVE

## 2024-08-11 NOTE — Progress Notes (Signed)
 Attempted pre-op call.  Left message for patient to call back.

## 2024-08-14 ENCOUNTER — Encounter (HOSPITAL_COMMUNITY): Payer: Self-pay

## 2024-08-14 ENCOUNTER — Telehealth (INDEPENDENT_AMBULATORY_CARE_PROVIDER_SITE_OTHER): Payer: Self-pay | Admitting: *Deleted

## 2024-08-14 ENCOUNTER — Other Ambulatory Visit: Payer: Self-pay

## 2024-08-14 NOTE — Telephone Encounter (Signed)
 Pt called in stating she did not receive her pre-op phone call appointment Friday. Advised will send the pre-op nurse a message to call her.

## 2024-08-15 ENCOUNTER — Encounter (HOSPITAL_COMMUNITY): Payer: Self-pay | Admitting: Gastroenterology

## 2024-08-15 ENCOUNTER — Ambulatory Visit (HOSPITAL_COMMUNITY)
Admission: RE | Admit: 2024-08-15 | Discharge: 2024-08-15 | Disposition: A | Discharge status: Home / Self Care | Attending: Gastroenterology | Admitting: Gastroenterology

## 2024-08-15 ENCOUNTER — Ambulatory Visit (HOSPITAL_COMMUNITY): Admitting: Certified Registered"

## 2024-08-15 ENCOUNTER — Encounter (HOSPITAL_COMMUNITY)
Admission: RE | Disposition: A | Discharge status: Home / Self Care | Payer: Self-pay | Source: Home / Self Care | Attending: Gastroenterology

## 2024-08-15 DIAGNOSIS — K648 Other hemorrhoids: Secondary | ICD-10-CM | POA: Diagnosis not present

## 2024-08-15 DIAGNOSIS — Z800 Family history of malignant neoplasm of digestive organs: Secondary | ICD-10-CM | POA: Diagnosis not present

## 2024-08-15 DIAGNOSIS — K219 Gastro-esophageal reflux disease without esophagitis: Secondary | ICD-10-CM | POA: Insufficient documentation

## 2024-08-15 DIAGNOSIS — Z1211 Encounter for screening for malignant neoplasm of colon: Secondary | ICD-10-CM | POA: Diagnosis present

## 2024-08-15 HISTORY — PX: COLONOSCOPY: SHX5424

## 2024-08-15 SURGERY — COLONOSCOPY
Anesthesia: General

## 2024-08-15 MED ORDER — PROPOFOL 10 MG/ML IV BOLUS
INTRAVENOUS | Status: DC | PRN
Start: 2024-08-15 — End: 2024-08-15
  Administered 2024-08-15: 100 mg via INTRAVENOUS
  Administered 2024-08-15: 175 ug/kg/min via INTRAVENOUS

## 2024-08-15 MED ORDER — LIDOCAINE HCL (CARDIAC) PF 100 MG/5ML IV SOSY
PREFILLED_SYRINGE | INTRAVENOUS | Status: DC | PRN
  Administered 2024-08-15: 50 mg via INTRAVENOUS

## 2024-08-15 MED ORDER — LACTATED RINGERS IV SOLN: INTRAVENOUS | Status: DC

## 2024-08-15 NOTE — Anesthesia Preprocedure Evaluation (Signed)
 Anesthesia Evaluation  Patient identified by MRN, date of birth, ID band Patient awake    Reviewed: Allergy & Precautions, H&P , NPO status , Patient's Chart, lab work & pertinent test results, reviewed documented beta blocker date and time   Airway Mallampati: II  TM Distance: >3 FB Neck ROM: full    Dental no notable dental hx. (+) Dental Advisory Given, Teeth Intact   Pulmonary neg pulmonary ROS   Pulmonary exam normal breath sounds clear to auscultation       Cardiovascular Exercise Tolerance: Good negative cardio ROS Normal cardiovascular exam Rhythm:regular Rate:Normal     Neuro/Psych negative neurological ROS  negative psych ROS   GI/Hepatic Neg liver ROS,GERD  ,,  Endo/Other  negative endocrine ROS    Renal/GU negative Renal ROS  negative genitourinary   Musculoskeletal   Abdominal   Peds  Hematology negative hematology ROS (+)   Anesthesia Other Findings   Reproductive/Obstetrics negative OB ROS                              Anesthesia Physical Anesthesia Plan  ASA: 1  Anesthesia Plan: General   Post-op Pain Management: Minimal or no pain anticipated   Induction: Intravenous  PONV Risk Score and Plan: Propofol infusion  Airway Management Planned: Natural Airway and Nasal Cannula  Additional Equipment: None  Intra-op Plan:   Post-operative Plan:   Informed Consent: I have reviewed the patients History and Physical, chart, labs and discussed the procedure including the risks, benefits and alternatives for the proposed anesthesia with the patient or authorized representative who has indicated his/her understanding and acceptance.     Dental Advisory Given  Plan Discussed with: CRNA  Anesthesia Plan Comments:          Anesthesia Quick Evaluation

## 2024-08-15 NOTE — H&P (Signed)
 Jocelyn Crosby is an 48 y.o. female.   Chief Complaint: family history colon cancer HPI: 48 y/o f with PMH GERD, coming for familyb history of colon cancer. The patient has never had a colonoscopy in the past.  The patient denies having any complaints such as melena, hematochezia, abdominal pain or distention, change in her bowel movement consistency or frequency, no changes in weight recently.  Father had colon cancer at age 39   Past Medical History:  Diagnosis Date   Family history of colon cancer in father    GERD (gastroesophageal reflux disease)     History reviewed. No pertinent surgical history.  History reviewed. No pertinent family history. Social History:  reports that she has never smoked. She has never used smokeless tobacco. She reports that she does not currently use alcohol. She reports that she does not use drugs.  Allergies: No Known Allergies  Medications Prior to Admission  Medication Sig Dispense Refill   cetirizine (ZYRTEC) 10 MG tablet Take 10 mg by mouth daily. As needed     omeprazole (PRILOSEC) 40 MG capsule Take 40 mg by mouth every evening.     pantoprazole (PROTONIX) 40 MG tablet Take 40 mg by mouth daily.      No results found for this or any previous visit (from the past 48 hours). No results found.  Review of Systems  All other systems reviewed and are negative.   Blood pressure 134/86, temperature 98.5 F (36.9 C), resp. rate 16, last menstrual period 08/11/2024, SpO2 100%. Physical Exam  GENERAL: The patient is AO x3, in no acute distress. HEENT: Head is normocephalic and atraumatic. EOMI are intact. Mouth is well hydrated and without lesions. NECK: Supple. No masses LUNGS: Clear to auscultation. No presence of rhonchi/wheezing/rales. Adequate chest expansion HEART: RRR, normal s1 and s2. ABDOMEN: Soft, nontender, no guarding, no peritoneal signs, and nondistended. BS +. No masses. EXTREMITIES: Without any cyanosis, clubbing, rash, lesions  or edema. NEUROLOGIC: AOx3, no focal motor deficit. SKIN: no jaundice, no rashes  Assessment/Plan  48 y/o f with PMH GERD, coming for familyb history of colon cancer. Will proceed with colonoscopy.  Toribio Eartha Flavors, MD 08/15/2024, 1:03 PM

## 2024-08-15 NOTE — Anesthesia Postprocedure Evaluation (Signed)
 Anesthesia Post Note  Patient: Jocelyn Crosby  Procedure(s) Performed: COLONOSCOPY  Patient location during evaluation: Phase II Anesthesia Type: General Level of consciousness: awake and alert Pain management: pain level controlled Vital Signs Assessment: post-procedure vital signs reviewed and stable Respiratory status: spontaneous breathing, nonlabored ventilation and respiratory function stable Cardiovascular status: stable Anesthetic complications: no   There were no known notable events for this encounter.   Last Vitals:  Vitals:   08/15/24 1406 08/15/24 1411  BP: (!) 86/30 (!) 98/58  Pulse: 65   Resp: 14   Temp: 36.6 C   SpO2: 100%     Last Pain:  Vitals:   08/15/24 1406  TempSrc: Oral  PainSc: 0-No pain                 Cohan Stipes L Alissa Pharr

## 2024-08-15 NOTE — Transfer of Care (Signed)
 Immediate Anesthesia Transfer of Care Note  Patient: Jocelyn Crosby  Procedure(s) Performed: COLONOSCOPY  Patient Location: Short Stay  Anesthesia Type:General  Level of Consciousness: awake, alert , oriented, and patient cooperative  Airway & Oxygen Therapy: Patient Spontanous Breathing  Post-op Assessment: Report given to RN and Post -op Vital signs reviewed and stable  Post vital signs: Reviewed and stable  Last Vitals:  Vitals Value Taken Time  BP 86/30 08/15/24 14:06  Temp 36.6 C 08/15/24 14:06  Pulse 65 08/15/24 14:06  Resp 14 08/15/24 14:06  SpO2 100 % 08/15/24 14:06    Last Pain:  Vitals:   08/15/24 1406  TempSrc: Oral  PainSc: 0-No pain         Complications: No notable events documented.

## 2024-08-15 NOTE — Discharge Instructions (Signed)
You are being discharged to home.  Resume your previous diet.  Your physician has recommended a repeat colonoscopy in five years for screening purposes.

## 2024-08-15 NOTE — Op Note (Signed)
 [facility] Patient Name: Jocelyn Crosby Procedure Date: 08/15/2024 1:29 PM MRN: [id number] Date of Birth: 01-25-1976 Attending MD: Toribio Fortune , , [id number] CSN: [id number] Age: 48 Admit Type: Outpatient Procedure:                Colonoscopy Indications:              Screening in patient at increased risk: Family                            history of 1st-degree relative with colorectal                            cancer before age 60 years Providers:                Toribio Fortune, Leandrew Edelman RN, RN, Bascom Blush Referring MD:              Medicines:                Monitored Anesthesia Care Complications:            No immediate complications. Estimated Blood Loss:     Estimated blood loss: none. Procedure:                Pre-Anesthesia Assessment:                           - Prior to the procedure, a History and Physical                            was performed, and patient medications, allergies                            and sensitivities were reviewed. The patient's                            tolerance of previous anesthesia was reviewed.                           - The risks and benefits of the procedure and the                            sedation options and risks were discussed with the                            patient. All questions were answered and informed                            consent was obtained.                           - ASA Grade Assessment: I - A normal, healthy                            patient.                           After obtaining informed consent, the  colonoscope                            was passed under direct vision. Throughout the                            procedure, the patient's blood pressure, pulse, and                            oxygen saturations were monitored continuously. The                            PCF-HQ190L ([id number]) Peds Colon was introduced                            through the anus and advanced to the the  cecum,                            identified by appendiceal orifice and ileocecal                            valve. The colonoscopy was performed without                            difficulty. The patient tolerated the procedure                            well. The quality of the bowel preparation was                            excellent. Scope In: 1:45:39 PM Scope Out: 2:03:14 PM Scope Withdrawal Time: 0 hours 10 minutes 14 seconds  Total Procedure Duration: 0 hours 17 minutes 35 seconds  Findings:      The perianal and digital rectal examinations were normal.      The entire examined colon appeared normal.      Non-bleeding internal hemorrhoids were found during retroflexion. The       hemorrhoids were small. Impression:               - The entire examined colon is normal.                           - Non-bleeding internal hemorrhoids.                           - No specimens collected. Moderate Sedation:      Per Anesthesia Care Recommendation:           - Discharge patient to home (ambulatory).                           - Resume previous diet.                           - Repeat colonoscopy in 5 years for screening  purposes. Procedure Code(s):        --- Professional ---                           H9894, Colorectal cancer screening; colonoscopy on                            individual at high risk Diagnosis Code(s):        --- Professional ---                           Z80.0, Family history of malignant neoplasm of                            digestive organs                           K64.8, Other hemorrhoids CPT copyright 2022 [employer]. All rights reserved. The codes documented in this report are preliminary and upon coder review may  be revised to meet current compliance requirements. Toribio Fortune, MD Toribio Fortune,  08/15/2024 2:07:52 PM This report has been signed electronically. Number of Addenda: 0

## 2024-08-18 ENCOUNTER — Encounter (HOSPITAL_COMMUNITY): Payer: Self-pay | Admitting: Gastroenterology
